# Patient Record
Sex: Male | Born: 1960 | Race: White | Hispanic: No | Marital: Married | Smoking: Never smoker
Health system: Southern US, Community
[De-identification: ages and names within clinical notes are randomized; demographics above are authoritative.]

## PROBLEM LIST (undated history)

## (undated) DIAGNOSIS — K589 Irritable bowel syndrome without diarrhea: Secondary | ICD-10-CM

## (undated) DIAGNOSIS — E559 Vitamin D deficiency, unspecified: Secondary | ICD-10-CM

## (undated) HISTORY — DX: Vitamin D deficiency, unspecified: E55.9

## (undated) HISTORY — PX: HERNIA REPAIR: SHX51

## (undated) HISTORY — DX: Irritable bowel syndrome, unspecified: K58.9

## (undated) HISTORY — PX: CATARACT EXTRACTION, BILATERAL: SHX1313

## (undated) HISTORY — PX: APPENDECTOMY: SHX54

---

## 1998-10-29 HISTORY — PX: KNEE ARTHROSCOPY: SUR90

## 1999-09-04 ENCOUNTER — Ambulatory Visit (HOSPITAL_BASED_OUTPATIENT_CLINIC_OR_DEPARTMENT_OTHER): Admission: RE | Admit: 1999-09-04 | Discharge: 1999-09-04 | Payer: Self-pay | Admitting: Surgery

## 2005-09-21 ENCOUNTER — Encounter: Admission: RE | Admit: 2005-09-21 | Discharge: 2005-09-21 | Payer: Self-pay | Admitting: Specialist

## 2005-10-29 HISTORY — PX: SHOULDER ARTHROSCOPY: SHX128

## 2007-08-23 IMAGING — CT CT EXTREM UP W/O CM*R*
4 of 5 series · 16 of 33 positions shown, 18 images · IV contrast (agent unspecified)
Comparison: none

CLINICAL DATA: Fall.  Injury with persistent right wrist pain.  Question scaphoid fracture. 
 RIGHT WRIST CT ? NO CONTRAST:
TECHNIQUE: Multidetector spiral axial images were obtained through the right wrist with high-resolution bone algorithm technique and no contrast. 
 No comparison.

[Series 3: recon 2: rt wrist · axial · 0.23mm/px · z∈[-28,+60]mm · 4 of 237 slices shown, 5 images]
[im 48/237  soft-tissue]
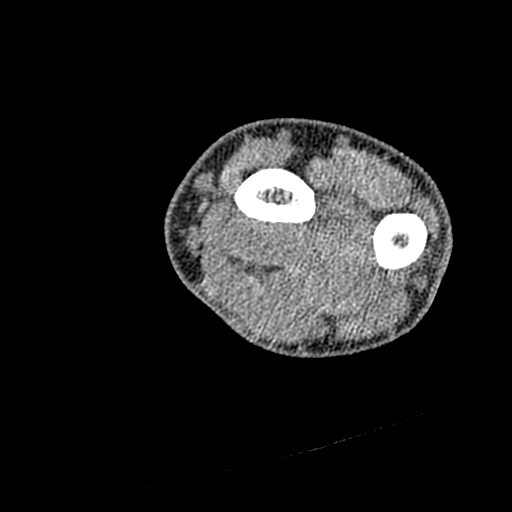
[im 48/237  bone]
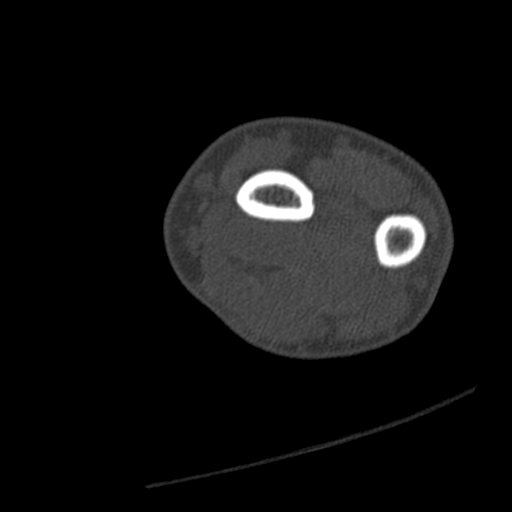
[im 95/237  bone]
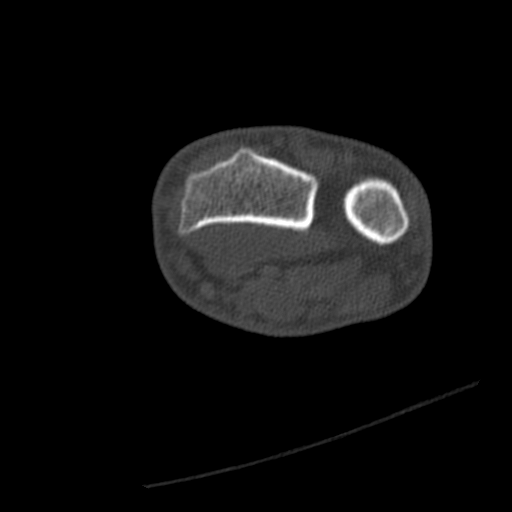
[im 142/237  bone]
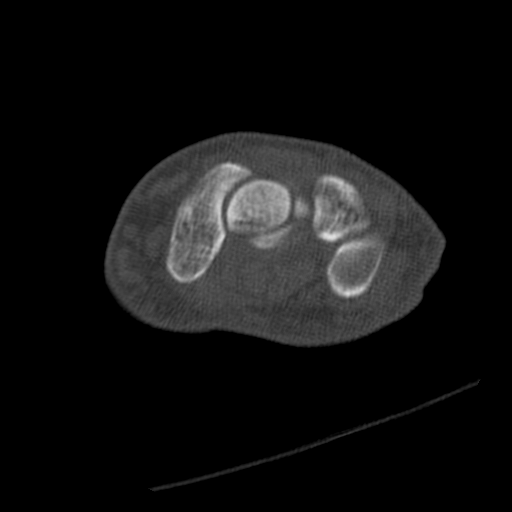
[im 189/237  bone]
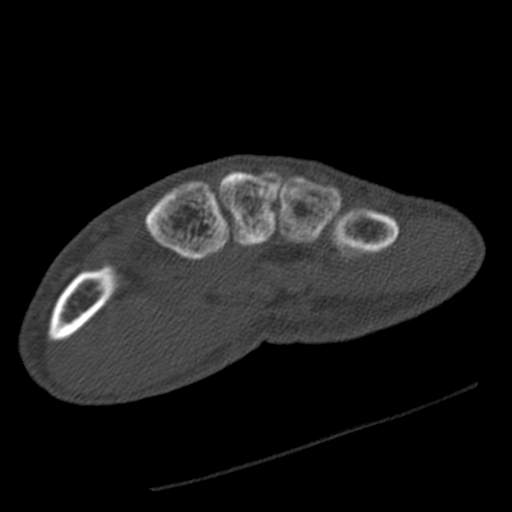

[Series 4: rt wrist · axial · 0.23mm/px · z∈[-26,+61]mm · 4 of 242 slices shown]
[im 49/242  bone]
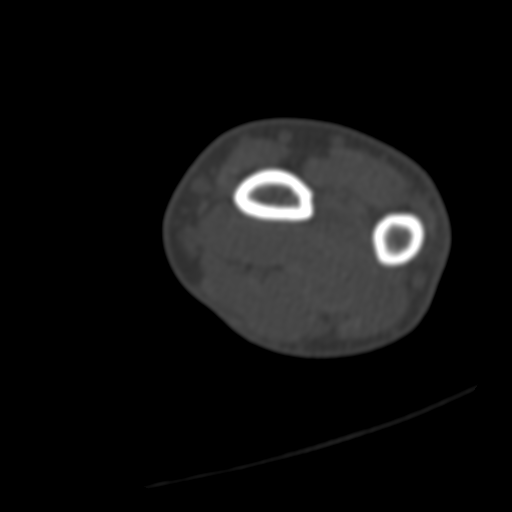
[im 97/242  bone]
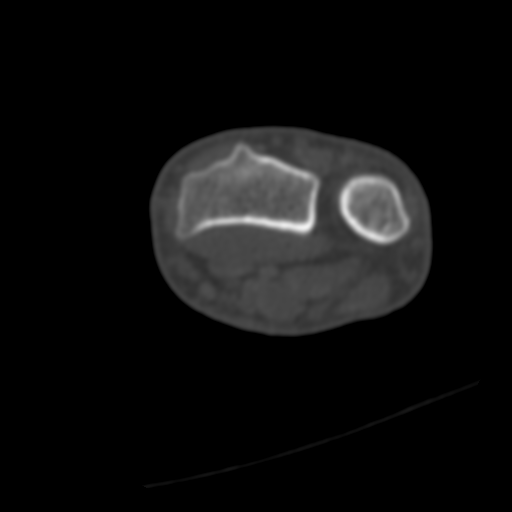
[im 145/242  bone]
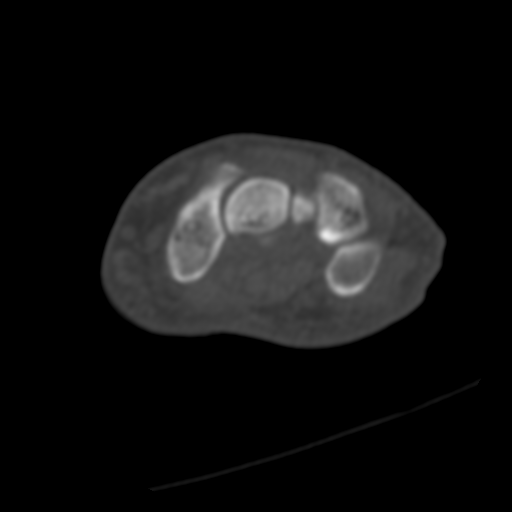
[im 193/242  bone]
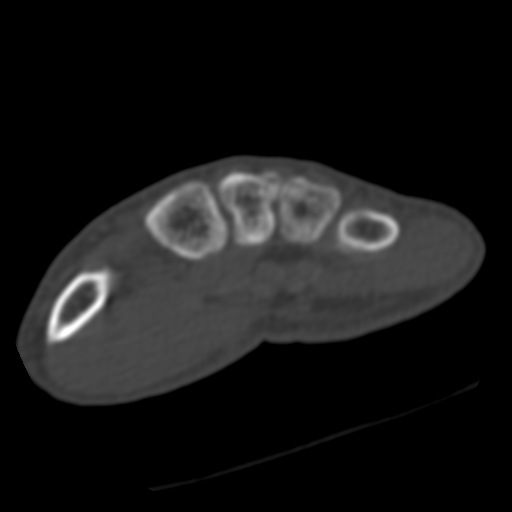

[Series 300: reformatted · sagittal · 0.30mm/px · 3 of 60 slices shown (1 of 2)]
[im 12/60  bone]
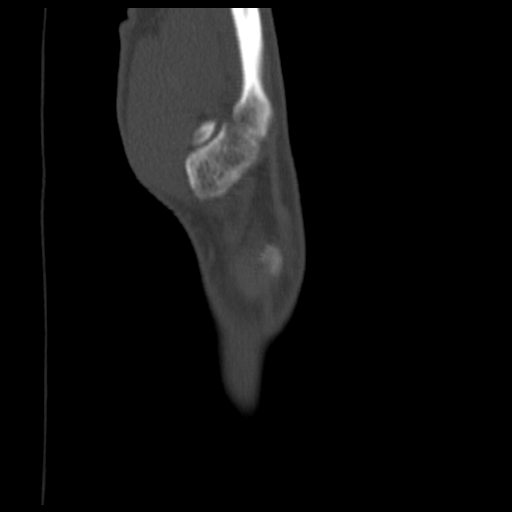
[im 24/60  bone]
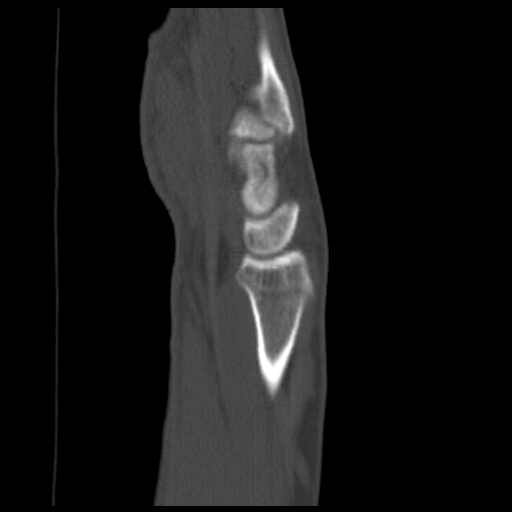
[im 36/60  bone]
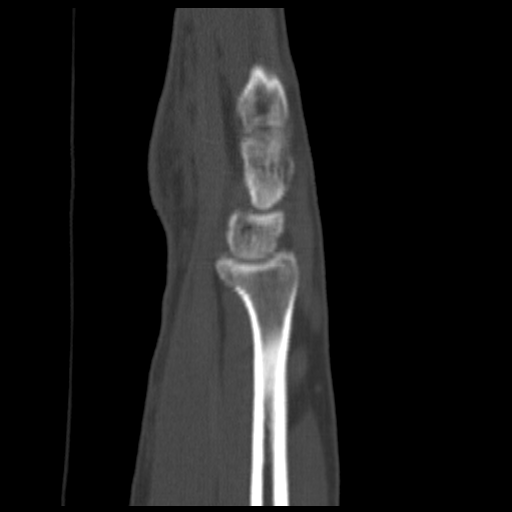

[Series 301: reformatted · coronal · 0.30mm/px · 5 of 40 slices shown, 6 images (2 of 2)]
[im 14/40  bone]
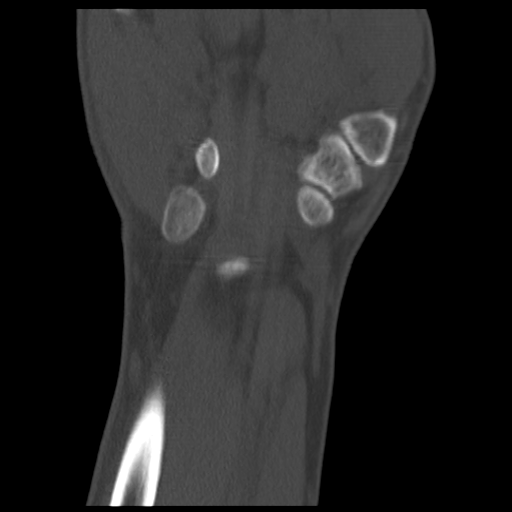
[im 17/40  bone]
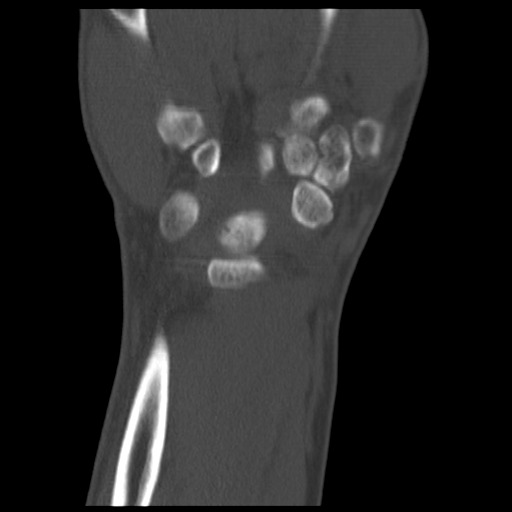
[im 20/40  soft-tissue]
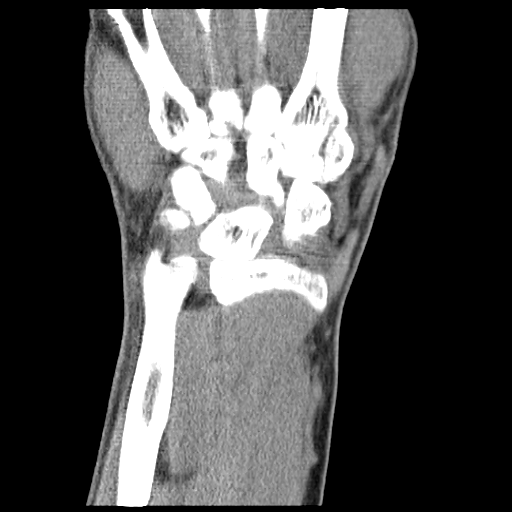
[im 20/40  bone]
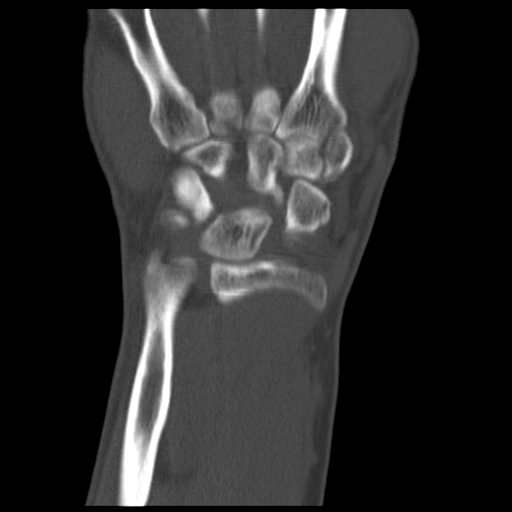
[im 23/40  bone]
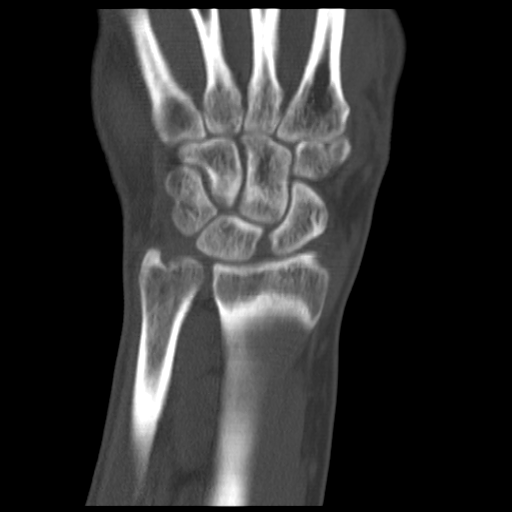
[im 27/40  bone]
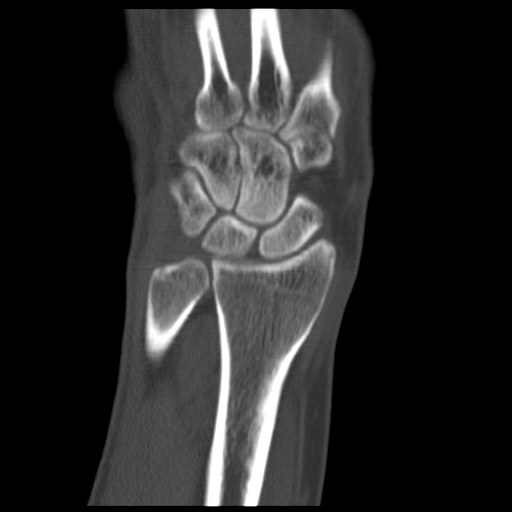

[16 of 33 positions shown; findings below may reference images not displayed]

FINDINGS: No fracture is seen specifically of the scaphoid.  Normal alignment is seen on 2D sagittal and coronal multiplanar reconstructions.
IMPRESSION: Negative.
 CT 3D INDEPENDENT WORK STATION:
FINDINGS: From source axial images, 3D CT independent work station images were obtained and demonstrate no significant abnormality.
IMPRESSION: Negative.

## 2013-01-23 ENCOUNTER — Encounter: Payer: Self-pay | Admitting: Family Medicine

## 2013-01-23 DIAGNOSIS — K589 Irritable bowel syndrome without diarrhea: Secondary | ICD-10-CM | POA: Insufficient documentation

## 2013-01-23 DIAGNOSIS — E559 Vitamin D deficiency, unspecified: Secondary | ICD-10-CM | POA: Insufficient documentation

## 2013-01-26 ENCOUNTER — Ambulatory Visit (INDEPENDENT_AMBULATORY_CARE_PROVIDER_SITE_OTHER): Payer: Managed Care, Other (non HMO) | Admitting: Physician Assistant

## 2013-01-26 ENCOUNTER — Encounter: Payer: Self-pay | Admitting: Physician Assistant

## 2013-01-26 VITALS — BP 120/92 | HR 72 | Temp 98.7°F | Resp 18 | Ht 74.0 in | Wt 235.0 lb

## 2013-01-26 DIAGNOSIS — A499 Bacterial infection, unspecified: Secondary | ICD-10-CM

## 2013-01-26 DIAGNOSIS — R5383 Other fatigue: Secondary | ICD-10-CM

## 2013-01-26 DIAGNOSIS — J069 Acute upper respiratory infection, unspecified: Secondary | ICD-10-CM

## 2013-01-26 DIAGNOSIS — R5381 Other malaise: Secondary | ICD-10-CM

## 2013-01-26 DIAGNOSIS — B9689 Other specified bacterial agents as the cause of diseases classified elsewhere: Secondary | ICD-10-CM

## 2013-01-26 LAB — COMPLETE METABOLIC PANEL WITH GFR
ALT: 19 U/L (ref 0–53)
AST: 15 U/L (ref 0–37)
Albumin: 4.8 g/dL (ref 3.5–5.2)
Alkaline Phosphatase: 56 U/L (ref 39–117)
BUN: 10 mg/dL (ref 6–23)
CO2: 29 mEq/L (ref 19–32)
Calcium: 9.6 mg/dL (ref 8.4–10.5)
Chloride: 106 mEq/L (ref 96–112)
Creat: 0.97 mg/dL (ref 0.50–1.35)
GFR, Est African American: >89 mL/min
GFR, Est Non African American: >89 mL/min
Glucose, Bld: 95 mg/dL (ref 70–99)
Potassium: 4.9 mEq/L (ref 3.5–5.3)
Sodium: 142 mEq/L (ref 135–145)
Total Bilirubin: 0.8 mg/dL (ref 0.3–1.2)
Total Protein: 7.1 g/dL (ref 6.0–8.3)

## 2013-01-26 LAB — CBC WITH DIFFERENTIAL/PLATELET
Basophils Absolute: 0.1 K/uL (ref 0.0–0.1)
Basophils Relative: 1 % (ref 0–1)
Eosinophils Absolute: 0.3 K/uL (ref 0.0–0.7)
Eosinophils Relative: 6 % — ABNORMAL HIGH (ref 0–5)
HCT: 42.8 % (ref 39.0–52.0)
Hemoglobin: 14.7 g/dL (ref 13.0–17.0)
Lymphocytes Relative: 26 % (ref 12–46)
Lymphs Abs: 1.5 K/uL (ref 0.7–4.0)
MCH: 29.6 pg (ref 26.0–34.0)
MCHC: 34.3 g/dL (ref 30.0–36.0)
MCV: 86.1 fL (ref 78.0–100.0)
Monocytes Absolute: 0.3 K/uL (ref 0.1–1.0)
Monocytes Relative: 5 % (ref 3–12)
Neutro Abs: 3.5 K/uL (ref 1.7–7.7)
Neutrophils Relative %: 62 % (ref 43–77)
Platelets: 280 K/uL (ref 150–400)
RBC: 4.97 MIL/uL (ref 4.22–5.81)
RDW: 13.7 % (ref 11.5–15.5)
WBC: 5.7 K/uL (ref 4.0–10.5)

## 2013-01-26 LAB — TSH: TSH: 3.476 u[IU]/mL (ref 0.350–4.500)

## 2013-01-26 MED ORDER — AMOXICILLIN-POT CLAVULANATE 875-125 MG PO TABS: 1.0000 | ORAL_TABLET | Freq: Two times a day (BID) | ORAL | Status: DC

## 2013-01-27 LAB — TESTOSTERONE: Testosterone: 232 ng/dL — ABNORMAL LOW (ref 300–890)

## 2013-01-27 NOTE — Progress Notes (Signed)
Patient ID: Kwali Wrinkle MRN: [id number], DOB: 1961-03-04, 52 y.o. Date of Encounter: @DATE @  Chief Complaint:  Chief Complaint  Patient presents with  . just not feeling good lately    ? sinus inf,  very lethargic, alot of drainage    HPI: 52 y.o. year old male  presents with:  1- Says for past 2 weeks he "just hasn't been feeling good." Feels really tired and lethargic. Has been sleeping good. 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 But thinks he is sleeping pretty good een in hotel. No increased stress.  2-Has drainage in throat. Blows nose in am -thick dark mucus. Some cough secondary to drainage. No chest congestion.  Does no routine exercise.    Past Medical History  Diagnosis Date  . Irritable bowel syndrome (IBS)   . Vitamin D deficiency      Home Meds: No current outpatient prescriptions on file prior to visit.   No current facility-administered medications on file prior to visit.    Allergies: No Known Allergies  History   Social History  . Marital Status: Married    Spouse Name: N/A    Number of Children: N/A  . Years of Education: N/A   Occupational History  . Not on file.   Social History Main Topics  . Smoking status: Never Smoker   . Smokeless tobacco: Current User     Comment: only occasional  . Alcohol Use: No  . Drug Use: No  . Sexually Active: Not on file   Other Topics Concern  . Not on file   Social History Narrative  . No narrative on file    No family history on file.   Review of Systems: Constitutional: negative for chills, fever, night sweats, weight changes, or fatigue  HEENT: negative for vision changes, hearing loss,  ST, epistaxis, or sinus pressure Cardiovascular: negative for chest pain or palpitations. No new/increased shortness of breath or dyspnea on exertion Respiratory: negative for hemoptysis, wheezing, shortness of breath Abdominal: negative for abdominal pain, nausea,  vomiting, diarrhea, or constipation Dermatological: negative for rash or concerning skin lesions Neurologic: negative for headache, dizziness, or syncope All other systems reviewed and are otherwise negative with the exception to those above and in the HPI.   Physical Exam: Blood pressure 120/92, pulse 72, temperature 98.7 F (37.1 C), temperature source Oral, resp. rate 18, height 6\' 2"  (1.88 m), weight 235 lb (106.595 kg)., Body mass index is 30.16 kg/(m^2). General: Well developed, well nourished,WM in no acute distress. Head: Normocephalic, atraumatic, eyes without discharge, sclera non-icteric, nares are without discharge. Bilateral auditory canals clear, TM's are without perforation, pearly grey and translucent with reflective cone of light bilaterally. Oral cavity moist, posterior pharynx without exudate, erythema, peritonsillar abscess, or post nasal drip.  Neck: Supple. No thyromegaly. Full ROM. No lymphadenopathy. Lungs: Clear bilaterally to auscultation without wheezes, rales, or rhonchi. Breathing is unlabored. Heart: RRR with S1 S2. No murmurs, rubs, or gallops. Abdomen: Soft, non-tender, non-distended with normoactive bowel sounds. No hepatomegaly. No rebound/guarding. No obvious abdominal masses. Musculoskeletal:  Strength and tone normal for age. Extremities/Skin: Warm and dry. No clubbing or cyanosis. No edema. No rashes or suspicious lesions. Neuro: Alert and oriented X 3. Moves all extremities spontaneously. Gait is normal. CNII-XII grossly in tact. Psych:  Responds to questions appropriately with a normal affect.    ASSESSMENT AND PLAN:  52 y.o. year old male with  1. Bacterial upper respiratory infection  -  amoxicillin-clavulanate (AUGMENTIN) 875-125 MG per tablet; Take 1 tablet by mouth 2 (two) times daily.  Dispense: 20 tablet; Refill: 0  2. Other malaise and fatigue  - CBC with Differential - COMPLETE METABOLIC PANEL WITH GFR - TSH - Testosterone  If labs are  normal(non-diagnostic) and if pts symptoms perstist after completion of antibiotics, then he is to f/u for further evaluation to determine etiology of his fatigue and lethargy. Pt voices understanding/agrees.  [address] [place], [place], [facility] 01/27/2013 9:46 AM

## 2013-01-29 ENCOUNTER — Telehealth: Payer: Self-pay | Admitting: Family Medicine

## 2013-01-29 NOTE — Telephone Encounter (Signed)
Pt out of town on business.  Spoke to wife with lab results.  Pt had not yet started antibiotics.  Told YES he needs to do that NOW and also start Zyrtec

## 2013-01-29 NOTE — Telephone Encounter (Signed)
Message copied by Donne Anon on Thu Jan 29, 2013 11:55 AM ------      Message from: Allayne Butcher      Created: Tue Jan 27, 2013 10:12 AM       At OV pt c/o fatigue/lethargy--tell him his testosterone was slightly low but other labs were normal.      No anemia. Electrolytes/kidney/liver nml. Thyroid Nml.      Recommend add Zyrtec otc QD in addition to abx.(also had uri sx at ov).      If fatigue, letargy do not resolve, sched f/u ov to do further labs to further eval testosterone deficincy and to discuss this further. ------

## 2013-06-05 ENCOUNTER — Encounter: Payer: Self-pay | Admitting: Family Medicine

## 2013-06-05 ENCOUNTER — Ambulatory Visit (INDEPENDENT_AMBULATORY_CARE_PROVIDER_SITE_OTHER): Payer: Managed Care, Other (non HMO) | Admitting: Family Medicine

## 2013-06-05 VITALS — BP 100/64 | HR 78 | Temp 98.2°F | Resp 18 | Wt 232.0 lb

## 2013-06-05 DIAGNOSIS — J019 Acute sinusitis, unspecified: Secondary | ICD-10-CM

## 2013-06-05 MED ORDER — AMOXICILLIN 400 MG/5ML PO SUSR: 1000.0000 mg | Freq: Two times a day (BID) | ORAL | Status: DC

## 2013-06-05 NOTE — Progress Notes (Signed)
  Subjective:    Patient ID: William Dalton, male    DOB: 1961-09-22, 52 y.o.   MRN: [id number]  HPI Patient is here today reporting pain and pressure in the right maxillary sinus for 2 weeks. He reports constant postnasal drip, a scratchy throat, worsening hoarseness, and a cough productive of green and yellow sputum. He denies fevers or chills. He reports a sinus headache. He denies any pain in his teeth. He denies a sore throat.  He denies otalgia, nausea, vomiting. Past Medical History  Diagnosis Date  . Irritable bowel syndrome (IBS)   . Vitamin D deficiency    No current outpatient prescriptions on file prior to visit.   No current facility-administered medications on file prior to visit.   No Known Allergies History   Social History  . Marital Status: Married    Spouse Name: N/A    Number of Children: N/A  . Years of Education: N/A   Occupational History  . Not on file.   Social History Main Topics  . Smoking status: Never Smoker   . Smokeless tobacco: Current User     Comment: only occasional  . Alcohol Use: No  . Drug Use: No  . Sexually Active: Not on file   Other Topics Concern  . Not on file   Social History Narrative  . No narrative on file      Review of Systems  All other systems reviewed and are negative.       Objective:   Physical Exam  Vitals reviewed. Constitutional: He appears well-developed and well-nourished.  HENT:  Head: Normocephalic and atraumatic.  Right Ear: Tympanic membrane and external ear normal.  Left Ear: Tympanic membrane and external ear normal.  Nose: Mucosal edema and rhinorrhea present. Right sinus exhibits maxillary sinus tenderness. Right sinus exhibits no frontal sinus tenderness. Left sinus exhibits no maxillary sinus tenderness and no frontal sinus tenderness.  Mouth/Throat: Oropharynx is clear and moist. No oropharyngeal exudate.  Cardiovascular: Normal rate, regular rhythm and normal heart sounds.    Pulmonary/Chest: Effort normal and breath sounds normal. No respiratory distress. He has no wheezes. He has no rales.          Assessment & Plan:  1. Acute rhinosinusitis Amoxacillin 1000 mg by mouth twice a day for 10 days. The patient cannot tolerate pills and is asking for a liquid. Also recommended nasal saline 4 times a day. Also recommended over-the-counter Sudafed. Followup in one week if no better or sooner if worse. - amoxicillin (AMOXIL) 400 MG/5ML suspension; Take 12.5 mLs (1,000 mg total) by mouth 2 (two) times daily.  Dispense: 250 mL; Refill: 0

## 2013-08-28 ENCOUNTER — Other Ambulatory Visit: Payer: Self-pay | Admitting: Dermatology

## 2013-09-03 ENCOUNTER — Other Ambulatory Visit: Payer: Self-pay

## 2013-10-02 ENCOUNTER — Telehealth: Payer: Self-pay | Admitting: Family Medicine

## 2013-10-02 NOTE — Telephone Encounter (Signed)
Husband home with body aches, congestion.  Question if viral.  No fever.  No appt available for today.  Recommended Urgent [facility] or [facility].  If viral just needs to treat symptomatically.  Rest, push fluids, take OTC cold and multi symptom relievers.

## 2013-11-20 ENCOUNTER — Encounter: Payer: Self-pay | Admitting: Family Medicine

## 2013-12-16 ENCOUNTER — Ambulatory Visit (INDEPENDENT_AMBULATORY_CARE_PROVIDER_SITE_OTHER): Payer: Managed Care, Other (non HMO) | Admitting: Physician Assistant

## 2013-12-16 ENCOUNTER — Encounter: Payer: Self-pay | Admitting: Physician Assistant

## 2013-12-16 VITALS — BP 126/90 | HR 72 | Temp 99.2°F | Resp 20 | Ht 74.5 in | Wt 240.0 lb

## 2013-12-16 DIAGNOSIS — Z0000 Encounter for general adult medical examination without abnormal findings: Secondary | ICD-10-CM

## 2013-12-16 DIAGNOSIS — J329 Chronic sinusitis, unspecified: Secondary | ICD-10-CM

## 2013-12-16 DIAGNOSIS — J209 Acute bronchitis, unspecified: Secondary | ICD-10-CM

## 2013-12-16 MED ORDER — PREDNISOLONE SODIUM PHOSPHATE 15 MG/5ML PO SOLN: ORAL | Status: DC

## 2013-12-16 MED ORDER — AMOXICILLIN-POT CLAVULANATE 400-57 MG/5ML PO SUSR: ORAL | Status: DC

## 2013-12-16 MED ORDER — PREDNISONE 20 MG PO TABS: ORAL_TABLET | ORAL | Status: DC

## 2013-12-16 NOTE — Progress Notes (Signed)
Patient ID: William DittyDavid Dalton MRN: [id number], DOB: 09/12/1961, 53 y.o. Date of Encounter: 12/16/2013, 2:02 PM    Chief Complaint:  Chief Complaint  Patient presents with  . sinus inf x 2 weeks    facial pain, pressure, drainage, using OTC sudafed     HPI: 53 y.o. year old white male reports that he's been having symptoms for about 2 weeks now. He kept thinking it would go away on its own but it hasn't. Having a lot of pressure in his cheek area bilaterally. Is not blowing much out of his nose except for early morning. Feels no chest congestion and only has a dry hacky cough from drainage in his throat. Has had no fevers or chills.     Home Meds: See attached medication section for any medications that were entered at today's visit. The computer does not put those onto this list.The following list is a list of meds entered prior to today's visit.   No current outpatient prescriptions on file prior to visit.   No current facility-administered medications on file prior to visit.    Allergies: No Known Allergies    Review of Systems: See HPI for pertinent ROS. All other ROS negative.    Physical Exam: Blood pressure 126/90, pulse 72, temperature 99.2 F (37.3 C), temperature source Oral, resp. rate 20, height 6' 2.5" (1.892 m), weight 240 lb (108.863 kg)., Body mass index is 30.41 kg/(m^2). General: WNWD WM.  Appears in no acute distress. HEENT: Normocephalic, atraumatic, eyes without discharge, sclera non-icteric, nares are without discharge. Bilateral auditory canals clear, TM's are without perforation, pearly grey and translucent with reflective cone of light bilaterally. Oral cavity moist, posterior pharynx without exudate, erythema, peritonsillar abscess. Moderate tenderness with percussion of bilateral maxillary sinuses. No tenderness with percussion of frontal sinuses.  Neck: Supple. No thyromegaly. No lymphadenopathy. Lungs: He has a very faint scattered wheeze on the left.  However he does have wheezes through out the right. They are much more faint up at the top that has moderate amount of wheeze to his the right base. Heart: Regular rhythm. No murmurs, rubs, or gallops. Msk:  Strength and tone normal for age. Extremities/Skin: Warm and dry. No rashes or suspicious lesions. Neuro: Alert and oriented X 3. Moves all extremities spontaneously. Gait is normal. CNII-XII grossly in tact. Psych:  Responds to questions appropriately with a normal affect.     ASSESSMENT AND PLAN:  53 y.o. year old male with  1. Sinusitis He cannot swallow pills. Says that the liquid antibiotic I prescribed him the last time it worked very well.This was Augmentin so will use  this again. The prednisone should help with the sinus pressure and pain as well as bronchitis/wheezing. Followup if symptoms do not resolve after completion of these medications. - amoxicillin-clavulanate (AUGMENTIN) 400-57 MG/5ML suspension; 2 teaspoons twice a day for 10 days  Dispense: 200 mL; Refill: 0 - prednisoLONE (ORAPRED) 15 MG/5ML solution; 4 teaspoons daily for 2 day then  3 teaspoons daily for 2 days then 2 teaspoons daily for 2 daysthen 1 teaspoon daily for 2 days.  Dispense: 473 mL; Refill: 0  2. Acute bronchitis - amoxicillin-clavulanate (AUGMENTIN) 400-57 MG/5ML suspension; 2 teaspoons twice a day for 10 days  Dispense: 200 mL; Refill: 0 - prednisoLONE (ORAPRED) 15 MG/5ML solution; 4 teaspoons daily for 2 day then  3 teaspoons daily for 2 days then 2 teaspoons daily for 2 daysthen 1 teaspoon daily for 2 days.  Dispense: 473 mL;  Refill: 0  As we were finishing his visit he notes that he is now 9 and his family members keep telling him that he should be having colonoscopies and prostate checks et Karie Soda. He asked me about this. I recommended he schedule a complete physical exam and he is agreeable to do so. Told him we could either do fasting labs at the time of the visit or he can come a couple days  prior to the visit and do the labs that we have the results to discuss at that visit. He would like to come for the labs prior to the visit so I am ordering these and he is scheduling at his appointment for complete physical exam as well.  3. Visit for preventive health examination - CBC with Differential; Future - COMPLETE METABOLIC PANEL WITH GFR; Future - Lipid panel; Future - PSA; Future - TSH; Future - Vit D  25 hydroxy (rtn osteoporosis monitoring); Future   Signed, Shon Hale [place], [place], [facility] 12/16/2013 2:02 PM

## 2013-12-25 ENCOUNTER — Other Ambulatory Visit: Payer: Managed Care, Other (non HMO)

## 2013-12-25 DIAGNOSIS — Z0000 Encounter for general adult medical examination without abnormal findings: Secondary | ICD-10-CM

## 2013-12-25 LAB — CBC WITH DIFFERENTIAL/PLATELET
Basophils Absolute: 0.1 10*3/uL (ref 0.0–0.1)
Basophils Relative: 1 % (ref 0–1)
Eosinophils Absolute: 0.4 10*3/uL (ref 0.0–0.7)
Eosinophils Relative: 4 % (ref 0–5)
HCT: 43 % (ref 39.0–52.0)
Hemoglobin: 14.9 g/dL (ref 13.0–17.0)
Lymphocytes Relative: 23 % (ref 12–46)
Lymphs Abs: 2.1 10*3/uL (ref 0.7–4.0)
MCH: 29 pg (ref 26.0–34.0)
MCHC: 34.7 g/dL (ref 30.0–36.0)
MCV: 83.8 fL (ref 78.0–100.0)
Monocytes Absolute: 0.6 10*3/uL (ref 0.1–1.0)
Monocytes Relative: 7 % (ref 3–12)
Neutro Abs: 5.9 10*3/uL (ref 1.7–7.7)
Neutrophils Relative %: 65 % (ref 43–77)
Platelets: 290 10*3/uL (ref 150–400)
RBC: 5.13 MIL/uL (ref 4.22–5.81)
RDW: 13.4 % (ref 11.5–15.5)
WBC: 9.1 10*3/uL (ref 4.0–10.5)

## 2013-12-25 LAB — COMPLETE METABOLIC PANEL WITH GFR
ALT: 16 U/L (ref 0–53)
AST: 14 U/L (ref 0–37)
Albumin: 4.4 g/dL (ref 3.5–5.2)
Alkaline Phosphatase: 55 U/L (ref 39–117)
BUN: 13 mg/dL (ref 6–23)
CO2: 26 mEq/L (ref 19–32)
Calcium: 9.1 mg/dL (ref 8.4–10.5)
Chloride: 102 mEq/L (ref 96–112)
Creat: 0.93 mg/dL (ref 0.50–1.35)
GFR, Est African American: >89 mL/min
GFR, Est Non African American: >89 mL/min
Glucose, Bld: 81 mg/dL (ref 70–99)
Potassium: 4.1 mEq/L (ref 3.5–5.3)
Sodium: 139 mEq/L (ref 135–145)
Total Bilirubin: 1.2 mg/dL (ref 0.2–1.2)
Total Protein: 6.9 g/dL (ref 6.0–8.3)

## 2013-12-25 LAB — LIPID PANEL
Cholesterol: 140 mg/dL (ref 0–200)
HDL: 42 mg/dL (ref >39)
LDL Cholesterol: 68 mg/dL (ref 0–99)
Total CHOL/HDL Ratio: 3.3 Ratio
Triglycerides: 149 mg/dL (ref <150)
VLDL: 30 mg/dL (ref 0–40)

## 2013-12-25 LAB — TSH: TSH: 5.311 u[IU]/mL — ABNORMAL HIGH (ref 0.350–4.500)

## 2013-12-25 LAB — PSA: PSA: 0.63 ng/mL (ref <=4.00)

## 2013-12-26 LAB — VITAMIN D 25 HYDROXY (VIT D DEFICIENCY, FRACTURES): Vit D, 25-Hydroxy: 22 ng/mL — ABNORMAL LOW (ref 30–89)

## 2013-12-28 ENCOUNTER — Ambulatory Visit (INDEPENDENT_AMBULATORY_CARE_PROVIDER_SITE_OTHER): Payer: Managed Care, Other (non HMO) | Admitting: Physician Assistant

## 2013-12-28 ENCOUNTER — Encounter: Payer: Self-pay | Admitting: Physician Assistant

## 2013-12-28 VITALS — BP 128/90 | HR 72 | Temp 98.2°F | Resp 18 | Ht 73.75 in | Wt 234.0 lb

## 2013-12-28 DIAGNOSIS — Z0000 Encounter for general adult medical examination without abnormal findings: Secondary | ICD-10-CM

## 2013-12-28 DIAGNOSIS — Z23 Encounter for immunization: Secondary | ICD-10-CM

## 2013-12-28 DIAGNOSIS — Z1212 Encounter for screening for malignant neoplasm of rectum: Secondary | ICD-10-CM

## 2013-12-28 DIAGNOSIS — E039 Hypothyroidism, unspecified: Secondary | ICD-10-CM

## 2013-12-28 DIAGNOSIS — Z1211 Encounter for screening for malignant neoplasm of colon: Secondary | ICD-10-CM

## 2013-12-28 NOTE — Progress Notes (Signed)
Patient ID: William DittyDavid Dalton MRN: [id number], DOB: 07/04/1961 53 y.o. Date of Encounter: 12/28/2013, 5:17 PM    Chief Complaint: Physical (CPE)  HPI: 53 y.o. y/o white male here for CPE.  He recently saw me for a visit secondary to respiratory infection. At that time at the end of the visit he said that a lot of family and friends keep mentioning that he needs to have a colonoscopy etc. I Recommended that he schedule a complete physical exam and he was agreeable. He wanted to come ahead of time to do fasting labs so he did this recently. He has no active complaints today.   Review of Systems: Consitutional: No fever, chills, fatigue, night sweats, lymphadenopathy, or weight changes. Eyes: No visual changes, eye redness, or discharge. ENT/Mouth: Ears: No otalgia, tinnitus, hearing loss, discharge. Nose: No congestion, rhinorrhea, sinus pain, or epistaxis. Throat: No sore throat, post nasal drip, or teeth pain. Cardiovascular: No CP, palpitations, diaphoresis, DOE, edema, orthopnea, PND. Respiratory: No cough, hemoptysis, SOB, or wheezing. Gastrointestinal: No anorexia, dysphagia, reflux, pain, nausea, vomiting, hematemesis, diarrhea, constipation, BRBPR, or melena. Genitourinary: No dysuria, frequency, urgency, hematuria, incontinence, nocturia, decreased urinary stream, discharge, impotence, or testicular pain/masses. Musculoskeletal: No decreased ROM, myalgias, stiffness, joint swelling, or weakness. Skin: No rash, erythema, lesion changes, pain, warmth, jaundice, or pruritis. Neurological: No headache, dizziness, syncope, seizures, tremors, memory loss, coordination problems, or paresthesias. Psychological: No anxiety, depression, hallucinations, SI/HI. Endocrine: No fatigue, polydipsia, polyphagia, polyuria, or known diabetes. All other systems were reviewed and are otherwise negative.  Past Medical History  Diagnosis Date  . Irritable bowel syndrome (IBS)  he says he has had no  problems with this in 2-3 years now.   . Vitamin D deficiency  he says that he never started taking any vitamin D supplement      Past Surgical History  Procedure Laterality Date  . Cataract extraction, bilateral    . Appendectomy    . Hernia repair    . Shoulder arthroscopy Right 10/29/2005  . Knee arthroscopy Bilateral 10/29/1998    Home Meds:  No current outpatient prescriptions on file prior to visit.   No current facility-administered medications on file prior to visit.    Allergies: No Known Allergies  History   Social History  . Marital Status: Married    Spouse Name: N/A    Number of Children: N/A  . Years of Education: N/A   Occupational History  . Not on file.   Social History Main Topics  . Smoking status: Never Smoker   . Smokeless tobacco: Current User     Comment: only occasional  . Alcohol Use: No  . Drug Use: No  . Sexual Activity: Not on file   Other Topics Concern  . Not on file   Social History Narrative  . No narrative on file   He works as a Surveyor, quantitystructural structural engineer. He is married. He has 2 children. Has a daughter named Maralyn SagoSarah who is age 51 and a son and who is 1113. He does no exercise.  Family History  Problem Relation Age of Onset  . Cancer Mother [id number]    bladder cancer    Physical Exam: Blood pressure 128/90, pulse 72, temperature 98.2 F (36.8 C), temperature source Oral, resp. rate 18, height 6' 1.75" (1.873 m), weight 234 lb (106.142 kg).  General: Well developed, well nourished,WM. Appears in no acute distress. HEENT: Normocephalic, atraumatic. Conjunctiva pink, sclera non-icteric. Pupils 2 mm constricting to 1 mm, round,  regular, and equally reactive to light and accomodation. EOMI. Internal auditory canal clear. TMs with good cone of light and without pathology. Nasal mucosa pink. Nares are without discharge. No sinus tenderness. Oral mucosa pink.  Pharynx without exudate.   Neck: Supple. Trachea midline. No thyromegaly. Full  ROM. No lymphadenopathy. Lungs: Clear to auscultation bilaterally without wheezes, rales, or rhonchi. Breathing is of normal effort and unlabored. Cardiovascular: RRR with S1 S2. No murmurs, rubs, or gallops. Distal pulses 2+ symmetrically. No carotid or abdominal bruits. Abdomen: Soft, non-tender, non-distended with normoactive bowel sounds. No hepatosplenomegaly or masses. No rebound/guarding. No CVA tenderness. No hernias. Rectal: Pt defers.  Musculoskeletal: Full range of motion and 5/5 strength throughout. Without swelling, atrophy, tenderness, crepitus, or warmth. Extremities without clubbing, cyanosis, or edema. Calves supple. Skin: Warm and moist without erythema, ecchymosis, wounds, or rash. Neuro: A+Ox3. CN II-XII grossly intact. Moves all extremities spontaneously. Full sensation throughout. Normal gait. DTR 2+ throughout upper and lower extremities. Finger to nose intact. Psych:  Responds to questions appropriately with a normal affect.   Assessment/Plan:  53 y.o. y/o  male here for CPE -1. Visit for preventive health examination  A. Screening Labs: Came fasting for labs on 12/25/13. CBC normal. CMET normal. Lipid panel is excellent. LDL 68. PSA normal. TSH slightly high at 5.311.  However 11 months ago was normal at 3.476.  see #4 below. Vitamin D was low at 22. I recommended that he start taking over-the-counter vitamin D 1000 units daily.  B. Screening For Prostate Cancer: PSA normal. He defers DRE.  C. Screening For Colorectal Cancer:  He has never had a screening colonoscopy. He is agreeable to followup with this. I have scheduled referral to GI.  D. Immunizations: Tetanus:  He does not think he has had a tetanus shot in over 10 years. He is agreeable to update now.    - Ambulatory referral to Gastroenterology  2. Screening for colorectal cancer - Ambulatory referral to Gastroenterology  3. Need for prophylactic vaccination with combined  diphtheria-tetanus-pertussis (DTP) vaccine - Tdap vaccine greater than or equal to 7yo IM  4. Hypothyroidism See #1 above.  Have him come back in several months to recheck a TSH as well as a free T4. - T4, free; Future - TSH; Future  5. vitamin D deficiency: Start over-the-counter vitamin D 1000 units daily.  Signed:   [address] [place], [place]  12/28/2013 5:17 PM

## 2014-03-08 ENCOUNTER — Encounter: Payer: Self-pay | Admitting: Physician Assistant

## 2014-03-29 ENCOUNTER — Ambulatory Visit (INDEPENDENT_AMBULATORY_CARE_PROVIDER_SITE_OTHER): Payer: Managed Care, Other (non HMO) | Admitting: Family Medicine

## 2014-03-29 ENCOUNTER — Encounter: Payer: Self-pay | Admitting: Family Medicine

## 2014-03-29 VITALS — BP 118/74 | HR 78 | Temp 97.7°F | Resp 16 | Ht 76.0 in | Wt 235.0 lb

## 2014-03-29 DIAGNOSIS — J019 Acute sinusitis, unspecified: Secondary | ICD-10-CM

## 2014-03-29 MED ORDER — AMOXICILLIN 875 MG PO TABS: 875.0000 mg | ORAL_TABLET | Freq: Two times a day (BID) | ORAL | Status: DC

## 2014-03-29 MED ORDER — FLUTICASONE PROPIONATE 50 MCG/ACT NA SUSP: 2.0000 | Freq: Every day | NASAL | Status: DC

## 2014-03-29 NOTE — Progress Notes (Signed)
   Subjective:    Patient ID: William Dalton, male    DOB: [birth year], 53 y.o.   MRN: [id number]  HPI Patient has had bilateral maxillary sinus pressure for 3 weeks. He now reports congestion and pain in his sinuses. He is having a dull constant headache. He feels pressure shift within his head whenever he leans forward bends over. He reports that both ears feel stopped up. He describes it as a sensation of water behind his eardrums.   He denies any history of allergy problems. He denies any fevers or chills. However the pain and pressure is worsening. Past Medical History  Diagnosis Date  . Irritable bowel syndrome (IBS)   . Vitamin D deficiency    No current outpatient prescriptions on file prior to visit.   No current facility-administered medications on file prior to visit.   No Known Allergies History   Social History  . Marital Status: Married    Spouse Name: N/A    Number of Children: N/A  . Years of Education: N/A   Occupational History  . Not on file.   Social History Main Topics  . Smoking status: Never Smoker   . Smokeless tobacco: Current User     Comment: only occasional  . Alcohol Use: No  . Drug Use: No  . Sexual Activity: Not on file   Other Topics Concern  . Not on file   Social History Narrative   Works as a Scientist, research (physical sciences).   Married. 2 children.   Does no exercise.      Review of Systems  All other systems reviewed and are negative.      Objective:   Physical Exam  Vitals reviewed. Constitutional: He appears well-developed and well-nourished. No distress.  HENT:  Head: Normocephalic and atraumatic.  Right Ear: Tympanic membrane, external ear and ear canal normal.  Left Ear: Tympanic membrane, external ear and ear canal normal. No decreased hearing is noted.  Nose: Mucosal edema and rhinorrhea present. Right sinus exhibits maxillary sinus tenderness. Right sinus exhibits no frontal sinus tenderness. Left sinus exhibits maxillary sinus  tenderness. Left sinus exhibits no frontal sinus tenderness.  Mouth/Throat: Oropharynx is clear and moist.  Eyes: Conjunctivae are normal. Pupils are equal, round, and reactive to light. Right eye exhibits no discharge. Left eye exhibits no discharge.  Neck: Neck supple.  Cardiovascular: Normal rate, regular rhythm and normal heart sounds.   No murmur heard. Pulmonary/Chest: Effort normal and breath sounds normal. No respiratory distress. He has no wheezes. He has no rales.  Lymphadenopathy:    He has no cervical adenopathy.  Skin: He is not diaphoretic.          Assessment & Plan:  1. Acute sinusitis - fluticasone (FLONASE) 50 MCG/ACT nasal spray; Place 2 sprays into both nostrils daily.  Dispense: 16 g; Refill: 6 - amoxicillin (AMOXIL) 875 MG tablet; Take 1 tablet (875 mg total) by mouth 2 (two) times daily.  Dispense: 20 tablet; Refill: 0

## 2014-11-30 ENCOUNTER — Ambulatory Visit (INDEPENDENT_AMBULATORY_CARE_PROVIDER_SITE_OTHER): Payer: PRIVATE HEALTH INSURANCE | Admitting: Family Medicine

## 2014-11-30 ENCOUNTER — Encounter: Payer: Self-pay | Admitting: Family Medicine

## 2014-11-30 VITALS — BP 128/70 | HR 68 | Temp 99.4°F | Resp 18 | Ht 76.0 in | Wt 237.0 lb

## 2014-11-30 DIAGNOSIS — J101 Influenza due to other identified influenza virus with other respiratory manifestations: Secondary | ICD-10-CM

## 2014-11-30 LAB — INFLUENZA A AND B
Inflenza A Ag: POSITIVE — AB
Influenza B Ag: NEGATIVE

## 2014-11-30 MED ORDER — OSELTAMIVIR PHOSPHATE 12 MG/ML PO SUSR: 75.0000 mg | Freq: Two times a day (BID) | ORAL | Status: DC

## 2014-11-30 MED ORDER — PROMETHAZINE-CODEINE 6.25-10 MG/5ML PO SYRP: 5.0000 mL | ORAL_SOLUTION | Freq: Four times a day (QID) | ORAL | Status: DC | PRN

## 2014-11-30 NOTE — Patient Instructions (Signed)
Take cough medicine with codiene Flu medication Fluids, rest F/U as needed

## 2014-11-30 NOTE — Progress Notes (Signed)
Patient ID: William DittyDavid Dalton, male   DOB: 06/14/1961, 54 y.o.   MRN: [id number]   Subjective:    Patient ID: William DittyDavid Dalton, male    DOB: 10/06/1961, 54 y.o.   MRN: [id number]  Patient presents for Illness  patient here with fever and body aches, dry cough mild nausea and one day of diarrhea for the past 72 hours. On visit contacts with some members from his work. He's been taking Tylenol Cold and sinus. He does not know his temperature has been in the high 99's. He does not take medication on a regular basis. He has not had his flu shot    Review Of Systems:  GEN- + fatigue,+ fever, weight loss,weakness, recent illness HEENT- denies eye drainage, change in vision, nasal discharge, CVS- denies chest pain, palpitations RESP- denies SOB, +cough, wheeze ABD- denies N/V, change in stools, abd pain GU- denies dysuria, hematuria, dribbling, incontinence MSK- denies joint pain, +muscle aches, injury Neuro- denies headache, dizziness, syncope, seizure activity       Objective:    BP 128/70 mmHg  Pulse 68  Temp(Src) 99.4 F (37.4 C) (Oral)  Resp 18  Ht 6\' 4"  (1.93 m)  Wt 237 lb (107.502 kg)  BMI 28.86 kg/m2 GEN- NAD, alert and oriented x3,ill appearing, flushed HEENT- PERRL, EOMI, non injected sclera, pink conjunctiva, MMM, oropharynx injected Neck- Supple, shotty LAD CVS- RRR, no murmur RESP-CTAB, no wheeze, harsh cough ABD-slightly hypoactive BS,soft,NT,ND  EXT- No edema Pulses- Radial 2+        Assessment & Plan:      Problem List Items Addressed This Visit    None    Visit Diagnoses    Influenza A    -  Primary    Treat with tamiflu and cough medication, fluids, rest    Relevant Medications    oseltamivir (TAMIFLU) 12 MG/ML suspension    Other Relevant Orders    Influenza a and b (Completed)       Note: This dictation was prepared with Dragon dictation along with smaller phrase technology. Any transcriptional errors that result from this process are unintentional.

## 2015-02-25 ENCOUNTER — Encounter: Payer: Self-pay | Admitting: Family Medicine

## 2015-02-25 ENCOUNTER — Ambulatory Visit (INDEPENDENT_AMBULATORY_CARE_PROVIDER_SITE_OTHER): Payer: PRIVATE HEALTH INSURANCE | Admitting: Family Medicine

## 2015-02-25 VITALS — BP 136/74 | HR 76 | Temp 98.5°F | Resp 14 | Ht 76.0 in | Wt 230.0 lb

## 2015-02-25 DIAGNOSIS — M545 Low back pain, unspecified: Secondary | ICD-10-CM

## 2015-02-25 DIAGNOSIS — N501 Vascular disorders of male genital organs: Secondary | ICD-10-CM | POA: Diagnosis not present

## 2015-02-25 DIAGNOSIS — Z125 Encounter for screening for malignant neoplasm of prostate: Secondary | ICD-10-CM | POA: Diagnosis not present

## 2015-02-25 DIAGNOSIS — N4889 Other specified disorders of penis: Secondary | ICD-10-CM

## 2015-02-25 LAB — URINALYSIS, ROUTINE W REFLEX MICROSCOPIC
Bilirubin Urine: NEGATIVE
Glucose, UA: NEGATIVE mg/dL
Hgb urine dipstick: NEGATIVE
Ketones, ur: NEGATIVE mg/dL
Leukocytes, UA: NEGATIVE
Nitrite: NEGATIVE
Protein, ur: NEGATIVE mg/dL
Urobilinogen, UA: 0.2 mg/dL (ref 0.0–1.0)
pH: 5.5 (ref 5.0–8.0)

## 2015-02-25 LAB — COMPREHENSIVE METABOLIC PANEL
ALT: 20 U/L (ref 0–53)
AST: 16 U/L (ref 0–37)
Albumin: 4.6 g/dL (ref 3.5–5.2)
Alkaline Phosphatase: 65 U/L (ref 39–117)
BUN: 13 mg/dL (ref 6–23)
CALCIUM: 9.3 mg/dL (ref 8.4–10.5)
CO2: 23 meq/L (ref 19–32)
Chloride: 105 mEq/L (ref 96–112)
Creat: 0.94 mg/dL (ref 0.50–1.35)
Glucose, Bld: 86 mg/dL (ref 70–99)
Potassium: 4.4 mEq/L (ref 3.5–5.3)
Sodium: 138 mEq/L (ref 135–145)
Total Bilirubin: 1 mg/dL (ref 0.2–1.2)
Total Protein: 7.6 g/dL (ref 6.0–8.3)

## 2015-02-25 LAB — CBC WITH DIFFERENTIAL/PLATELET
Basophils Absolute: 0.1 10*3/uL (ref 0.0–0.1)
Basophils Relative: 1 % (ref 0–1)
EOS ABS: 0.5 10*3/uL (ref 0.0–0.7)
Eosinophils Relative: 8 % — ABNORMAL HIGH (ref 0–5)
HCT: 44.3 % (ref 39.0–52.0)
Hemoglobin: 15.2 g/dL (ref 13.0–17.0)
Lymphocytes Relative: 24 % (ref 12–46)
Lymphs Abs: 1.4 10*3/uL (ref 0.7–4.0)
MCH: 29.5 pg (ref 26.0–34.0)
MCHC: 34.3 g/dL (ref 30.0–36.0)
MCV: 85.9 fL (ref 78.0–100.0)
MPV: 9.3 fL (ref 8.6–12.4)
Monocytes Absolute: 0.4 10*3/uL (ref 0.1–1.0)
Monocytes Relative: 7 % (ref 3–12)
Neutro Abs: 3.6 10*3/uL (ref 1.7–7.7)
Neutrophils Relative %: 60 % (ref 43–77)
Platelets: 284 10*3/uL (ref 150–400)
RBC: 5.16 MIL/uL (ref 4.22–5.81)
RDW: 13.8 % (ref 11.5–15.5)
WBC: 6 10*3/uL (ref 4.0–10.5)

## 2015-02-25 NOTE — Patient Instructions (Signed)
Get xray done Monday  [address]  Between 8-5pm walk in We will call with lab results F/U as needed

## 2015-02-25 NOTE — Progress Notes (Signed)
Patient ID: William DittyDavid Dalton, male   DOB: 08/15/1961, 54 y.o.   MRN: [id number]   Subjective:    Patient ID: William DittyDavid Dalton, male    DOB: 04/22/1961, 54 y.o.   MRN: [id number]  Patient presents for Lower Back Pain   patient here with right lower back pain for the past month. He states it feels like a strain in his back. It is not excruciating. There is no radiating symptoms. His wife was concerned because he had 2 episodes where he had a few spots of blood in his underwear over the past week. He denied any pain with urination or change in his bowel movements or straining. He states that he feels well otherwise. He is not taking any over-the-counter medications. He denies any injury to his back he did have a recent infection with the flu 2 months ago. He does have family history of kidney and bladder cancer in his grandparents as well as his mother   Review Of Systems:  GEN- denies fatigue, fever, weight loss,weakness, recent illness HEENT- denies eye drainage, change in vision, nasal discharge, CVS- denies chest pain, palpitations RESP- denies SOB, cough, wheeze ABD- denies N/V, change in stools, abd pain GU- denies dysuria, hematuria, dribbling, incontinence MSK- + joint pain, muscle aches, injury Neuro- denies headache, dizziness, syncope, seizure activity       Objective:    BP 136/74 mmHg  Pulse 76  Temp(Src) 98.5 F (36.9 C) (Oral)  Resp 14  Ht 6\' 4"  (1.93 m)  Wt 230 lb (104.327 kg)  BMI 28.01 kg/m2 GEN- NAD, alert and oriented x3 HEENT- PERRL, EOMI, non injected sclera, pink conjunctiva, MMM, oropharynx clear ABD-NABS,soft,NT,ND MSK- Spine NT, TTP right paraspinals and flank area, neg SLR, FROM hip,GOOD rom SPINE, pain with flexion and rotation GU- normal rectal tone, normal external apperance, prostate smooth, no nodules, NT, no bogginess, no gross blood Pulses- Radial - 2+  UA- negative       Assessment & Plan:      Problem List Items Addressed This Visit    None     Visit Diagnoses    Right-sided low back pain without sciatica    -  Primary    I think this is most likley MSK, however does not explain the penile bleeding he had though small amount, Xray of spine, KUB r/u stone    Relevant Orders    Urinalysis, Routine w reflex microscopic (Completed)    Urine culture    DG Abd 1 View    DG Lumbar Spine Complete    Penile bleeding        small amount and short lived, doubt prostatitis, check renal function, urine culture as well,     Relevant Orders    CBC with Differential/Platelet    Comprehensive metabolic panel    Urine culture    DG Abd 1 View    Prostate cancer screening        Relevant Orders    PSA       Note: This dictation was prepared with Dragon dictation along with smaller phrase technology. Any transcriptional errors that result from this process are unintentional.

## 2015-02-26 LAB — URINE CULTURE
Colony Count: NO GROWTH
Organism ID, Bacteria: NO GROWTH

## 2015-02-26 LAB — PSA: PSA: 0.6 ng/mL (ref <=4.00)

## 2015-03-21 ENCOUNTER — Encounter: Payer: Self-pay | Admitting: *Deleted

## 2015-07-19 ENCOUNTER — Encounter: Payer: Self-pay | Admitting: Physician Assistant

## 2015-08-22 ENCOUNTER — Encounter: Payer: Self-pay | Admitting: Physician Assistant

## 2015-08-22 ENCOUNTER — Ambulatory Visit (INDEPENDENT_AMBULATORY_CARE_PROVIDER_SITE_OTHER): Payer: PRIVATE HEALTH INSURANCE | Admitting: Physician Assistant

## 2015-08-22 VITALS — BP 132/90 | HR 72 | Temp 98.1°F | Resp 18 | Wt 233.0 lb

## 2015-08-22 DIAGNOSIS — J988 Other specified respiratory disorders: Secondary | ICD-10-CM

## 2015-08-22 DIAGNOSIS — B9689 Other specified bacterial agents as the cause of diseases classified elsewhere: Principal | ICD-10-CM

## 2015-08-22 MED ORDER — AZITHROMYCIN 250 MG PO TABS: ORAL_TABLET | ORAL | Status: DC

## 2015-08-22 NOTE — Progress Notes (Signed)
    Patient ID: William Dalton MRN: [id number], DOB: 05/21/1961, [age] y.William Dalton. Date of Encounter: 08/22/2015, 10:22 AM    Chief Complaint:  Chief Complaint  Patient presents with  . sick 8-10 days    congestion, fatigue, sinus tight, now chest     HPI: 54 y.o. year old white male resents with above symptoms. Is that symptoms got worse over the past couple of days. Is that his sinuses feel "full" and his ears feel "plugged". Says he also has some cough but is unable to produce much phlegm. Has had no known fevers or chills. No significant sore throat or earache.     Home Meds:   No outpatient prescriptions prior to visit.   No facility-administered medications prior to visit.    Allergies: No Known Allergies    Review of Systems: See HPI for pertinent ROS. All other ROS negative.    Physical Exam: Blood pressure 132/90, pulse 72, temperature 98.1 F (36.7 C), temperature source Oral, resp. rate 18, weight 233 lb (105.688 kg)., Body mass index is 28.37 kg/(m^2). General:  WNWD WM. Appears in no acute distress. HEENT: Normocephalic, atraumatic, eyes without discharge, sclera non-icteric, nares are without discharge. Bilateral auditory canals clear, TM's are without perforation, pearly grey and translucent with reflective cone of light bilaterally. Oral cavity moist, posterior pharynx without exudate, erythema, peritonsillar abscess. No tenderness with percussion of frontal or maxillary sinuses bilaterally.  Neck: Supple. No thyromegaly. No lymphadenopathy. Lungs: Clear bilaterally to auscultation without wheezes, rales, or rhonchi. Breathing is unlabored. Heart: Regular rhythm. No murmurs, rubs, or gallops. Msk:  Strength and tone normal for age. Extremities/Skin: Warm and dry. Neuro: Alert and oriented X 3. Moves all extremities spontaneously. Gait is normal. CNII-XII grossly in tact. Psych:  Responds to questions appropriately with a normal affect.     ASSESSMENT AND PLAN:  54  y.o. year old male with  1. Bacterial respiratory infection He is to start antibiotic immediately take as directed and complete all of it. Can also use over-the-counter decongestants and cough medications as needed for symptom relief. Follow-up if symptoms do not resolve within 1 week after completion of antibiotic. - azithromycin (ZITHROMAX) 250 MG tablet; Day 1: Take 2 daily. Days 2-5: Take 1 daily.  Dispense: 6 tablet; Refill: 0   Signed, [address] [place] Park[place], [place], [facility] 08/22/2015 10:22 AM

## 2016-04-20 ENCOUNTER — Ambulatory Visit (INDEPENDENT_AMBULATORY_CARE_PROVIDER_SITE_OTHER): Payer: PRIVATE HEALTH INSURANCE | Admitting: Family Medicine

## 2016-04-20 ENCOUNTER — Encounter: Payer: Self-pay | Admitting: Family Medicine

## 2016-04-20 VITALS — BP 118/64 | HR 76 | Temp 98.3°F | Resp 14 | Ht 76.0 in | Wt 232.0 lb

## 2016-04-20 DIAGNOSIS — J069 Acute upper respiratory infection, unspecified: Secondary | ICD-10-CM | POA: Diagnosis not present

## 2016-04-20 MED ORDER — AZITHROMYCIN 200 MG/5ML PO SUSR: 500.0000 mg | Freq: Every day | ORAL | Status: DC

## 2016-04-20 MED ORDER — HYDROCOD POLST-CPM POLST ER 10-8 MG/5ML PO SUER: 5.0000 mL | Freq: Two times a day (BID) | ORAL | Status: DC | PRN

## 2016-04-20 NOTE — Progress Notes (Signed)
Patient ID: William Dalton, male   DOB: 10/11/1961, [age] y.o.   MRN: [id number]    Subjective:    Patient ID: William DittyDavid Dalton, male    DOB: 02/01/1961, 55 y.o.   MRN: [id number]  Patient presents for Illness Patient here with cough with production sore throat from postnasal drip for the past 4 days. Subjective fever and chills. He has been traveling a lot with his job he has been within a [employer] [place] however. He isn't taking over-the-counter decongestant liquid Tylenol. He is a nonsmoker. No regular medications.    Review Of Systems:  GEN- denies fatigue,+ fever, weight loss,weakness, recent illness HEENT- denies eye drainage, change in vision, +nasal discharge, CVS- denies chest pain, palpitations RESP- denies SOB,+ cough, wheeze ABD- denies N/V, change in stools, abd pain Neuro- denies headache, dizziness, syncope, seizure activity       Objective:    BP 118/64 mmHg  Pulse 76  Temp(Src) 98.3 F (36.8 C) (Oral)  Resp 14  Ht 6\' 4"  (1.93 m)  Wt 232 lb (105.235 kg)  BMI 28.25 kg/m2  SpO2 98% GEN- NAD, alert and oriented x3 HEENT- PERRL, EOMI, non injected sclera, pink conjunctiva, MMM, oropharynx mild injection, TM clear bilat no effusion,  + maxillary sinus tenderness, inflammed turbinates,  Nasal drainage  Neck- Supple, no LAD CVS- RRR, no murmur RESP-CTAB EXT- No edema Pulses- Radial 2+         Assessment & Plan:      Problem List Items Addressed This Visit    None    Visit Diagnoses    Acute URI    -  Primary    More viral URI at this time since he travels I gave script for antibiotics, wait through weekend before starting. Given Tussionex for cough. F/U as needed Continue decongestant, okay to use nasal saline    Relevant Medications    azithromycin (ZITHROMAX) 200 MG/5ML suspension       Note: This dictation was prepared with Dragon dictation along with smaller phrase technology. Any transcriptional errors that result from this process are unintentional.

## 2016-04-20 NOTE — Patient Instructions (Signed)
F/U as needed

## 2016-05-18 ENCOUNTER — Ambulatory Visit (INDEPENDENT_AMBULATORY_CARE_PROVIDER_SITE_OTHER): Payer: PRIVATE HEALTH INSURANCE | Admitting: Family Medicine

## 2016-05-18 ENCOUNTER — Encounter: Payer: Self-pay | Admitting: Family Medicine

## 2016-05-18 VITALS — BP 116/70 | HR 67 | Temp 98.9°F | Resp 20 | Wt 231.0 lb

## 2016-05-18 DIAGNOSIS — J0110 Acute frontal sinusitis, unspecified: Secondary | ICD-10-CM

## 2016-05-18 DIAGNOSIS — J209 Acute bronchitis, unspecified: Secondary | ICD-10-CM

## 2016-05-18 MED ORDER — AMOXICILLIN-POT CLAVULANATE 400-57 MG/5ML PO SUSR: 800.0000 mg | Freq: Two times a day (BID) | ORAL | Status: AC

## 2016-05-18 MED ORDER — METHYLPREDNISOLONE ACETATE 40 MG/ML IJ SUSP
40.0000 mg | Freq: Once | INTRAMUSCULAR | Status: AC
  Administered 2016-05-18: 40 mg via INTRAMUSCULAR

## 2016-05-18 MED ORDER — FLUTICASONE PROPIONATE 50 MCG/ACT NA SUSP: 2.0000 | Freq: Every day | NASAL | Status: AC

## 2016-05-18 MED ORDER — CEFTRIAXONE SODIUM 500 MG IJ SOLR
500.0000 mg | Freq: Once | INTRAMUSCULAR | Status: AC
  Administered 2016-05-18: 500 mg via INTRAMUSCULAR

## 2016-05-18 NOTE — Patient Instructions (Signed)
Start antibiotics tomorrow Use nasal steroid  Steroid shot given  Salt water gargle  F/U as needed

## 2016-05-18 NOTE — Progress Notes (Signed)
Patient ID: Margretta DittyDavid Karapetyan, male   DOB: 04/08/1961, 55 y.o.   MRN: [id number]   Subjective:    Patient ID: Margretta DittyDavid Glaeser, male    DOB: 03/03/1961, 55 y.o.   MRN: [id number]  Patient presents for Acute Visit Patient here with persistent illness. He was seen about a month ago for URI he waited a few days as we initially thought this is viral but then he took the azithromycin. He stopped he was improving but then after completing the antibiotics he continued to decline. He has significant sinus pressure and drainage postnasal drip which are causing some cough. The cough is nonproductive. He did not tolerate the cough medicine he calls weird dreams. He has not had any fever. Overall he does not feel bad but he does get coughing spells throughout the day feels like his lungs are irritated. The most significant complaint is the sore throat from the drainage and sinus pressure. No OTC meds taken     Review Of Systems:  GEN- denies fatigue, fever, weight loss,weakness, recent illness HEENT- denies eye drainage, change in vision, +nasal discharge, CVS- denies chest pain, palpitations RESP- denies SOB,+ cough, wheeze ABD- denies N/V, change in stools, abd pain GU- denies dysuria, hematuria, dribbling, incontinence MSK- denies joint pain, muscle aches, injury Neuro- denies headache, dizziness, syncope, seizure activity       Objective:    BP 116/70 mmHg  Pulse 67  Temp(Src) 98.9 F (37.2 C)  Resp 20  Wt 231 lb (104.781 kg)  SpO2 97% GEN- NAD, alert and oriented x3 HEENT- PERRL, EOMI, non injected sclera, pink conjunctiva, MMM, oropharynx mild injection,no exudates  TM clear bilat no effusion,  + frontal sinus tenderness, inflammed turbinates,  Nasal drainage  Neck- Supple, shotty ant  LAD CVS- RRR, no murmur RESP-CTAB EXT- No edema Pulses- Radial 2+          Assessment & Plan:      Problem List Items Addressed This Visit    None    Visit Diagnoses    Acute frontal sinusitis,  recurrence not specified    -  Primary    Now with sinusitis status post URI I think postnasal drip is causing more Bronchitic cough. I've given her a shot of Depo-Medrol 40 mg Rocephin 500 he will augmentin tomorrow Nasal steroid, salt water gargle    Relevant Medications    fluticasone (FLONASE) 50 MCG/ACT nasal spray    amoxicillin-clavulanate (AUGMENTIN) 400-57 MG/5ML suspension    methylPREDNISolone acetate (DEPO-MEDROL) injection 40 mg (Completed)    cefTRIAXone (ROCEPHIN) injection 500 mg (Completed)    Acute bronchitis, unspecified organism        Relevant Medications    methylPREDNISolone acetate (DEPO-MEDROL) injection 40 mg (Completed)    cefTRIAXone (ROCEPHIN) injection 500 mg (Completed)       Note: This dictation was prepared with Dragon dictation along with smaller phrase technology. Any transcriptional errors that result from this process are unintentional.

## 2016-08-31 ENCOUNTER — Telehealth: Payer: Self-pay | Admitting: Physician Assistant

## 2016-08-31 ENCOUNTER — Ambulatory Visit (INDEPENDENT_AMBULATORY_CARE_PROVIDER_SITE_OTHER): Payer: PRIVATE HEALTH INSURANCE

## 2016-08-31 DIAGNOSIS — Z23 Encounter for immunization: Secondary | ICD-10-CM

## 2016-08-31 NOTE — Telephone Encounter (Signed)
Pls see note below and advise  

## 2016-08-31 NOTE — Telephone Encounter (Signed)
Patient is calling to say he is taking a flight next week and would like to know if he can get 2 pills called in for anxiety if possible, he gets very anxious when flying.  [facility]

## 2016-09-03 MED ORDER — ALPRAZOLAM ER 0.5 MG PO TB24: 0.5000 mg | ORAL_TABLET | ORAL | 0 refills | Status: DC | PRN

## 2016-09-03 NOTE — Telephone Encounter (Signed)
Rx filled

## 2016-09-03 NOTE — Telephone Encounter (Signed)
Xanax 0.5 mg Take 1 PRN anxiety.  # 10 + 0.  Tell pt to take one---30 minutes later, if seeing no effect, can take a 2nd one.

## 2016-09-05 ENCOUNTER — Telehealth: Payer: Self-pay

## 2016-09-05 MED ORDER — ALPRAZOLAM ER 0.5 MG PO TB24: 0.5000 mg | ORAL_TABLET | ORAL | 0 refills | Status: AC | PRN

## 2016-09-05 NOTE — Telephone Encounter (Signed)
Pt stated pharmacy did not have Rx, Called pharmacy to confirm they did not rec Rx  William HaleMary Beth approved this on 11-6
